# Patient Record
Sex: Female | Born: 1995 | Race: White | Hispanic: No | Marital: Single
Health system: Southern US, Community
[De-identification: ages and names within clinical notes are randomized; demographics above are authoritative.]

---

## 2018-04-15 ENCOUNTER — Other Ambulatory Visit: Payer: Self-pay

## 2018-04-15 ENCOUNTER — Emergency Department (HOSPITAL_COMMUNITY)
Admission: EM | Admit: 2018-04-15 | Discharge: 2018-04-15 | Disposition: A | Discharge status: Home / Self Care | Payer: 59 | Attending: Emergency Medicine | Admitting: Emergency Medicine

## 2018-04-15 ENCOUNTER — Encounter (HOSPITAL_COMMUNITY): Payer: Self-pay | Admitting: Emergency Medicine

## 2018-04-15 ENCOUNTER — Emergency Department (HOSPITAL_COMMUNITY): Payer: 59

## 2018-04-15 DIAGNOSIS — Y939 Activity, unspecified: Secondary | ICD-10-CM | POA: Insufficient documentation

## 2018-04-15 DIAGNOSIS — M25462 Effusion, left knee: Secondary | ICD-10-CM | POA: Insufficient documentation

## 2018-04-15 DIAGNOSIS — Y92012 Bathroom of single-family (private) house as the place of occurrence of the external cause: Secondary | ICD-10-CM | POA: Diagnosis not present

## 2018-04-15 DIAGNOSIS — S8991XA Unspecified injury of right lower leg, initial encounter: Secondary | ICD-10-CM | POA: Diagnosis present

## 2018-04-15 DIAGNOSIS — W1830XA Fall on same level, unspecified, initial encounter: Secondary | ICD-10-CM | POA: Diagnosis not present

## 2018-04-15 DIAGNOSIS — Y999 Unspecified external cause status: Secondary | ICD-10-CM | POA: Diagnosis not present

## 2018-04-15 MED ORDER — TRAMADOL HCL 50 MG PO TABS: 50.0000 mg | ORAL_TABLET | Freq: Four times a day (QID) | ORAL | 0 refills | Status: AC | PRN

## 2018-04-15 MED ORDER — OXYCODONE-ACETAMINOPHEN 5-325 MG PO TABS
1.0000 | ORAL_TABLET | Freq: Once | ORAL | Status: AC
  Administered 2018-04-15: 1 via ORAL
  Filled 2018-04-15: qty 1

## 2018-04-15 NOTE — Discharge Instructions (Signed)
Return to ED for worsening symptoms, red hot or tender joint, additional injuries or falls, numbness in your legs.

## 2018-04-15 NOTE — ED Triage Notes (Signed)
Pt c/o of throbbing left knee pain from a fall last week. Has iced and elevated every day. Says it hasn't gotten any better.

## 2018-04-15 NOTE — ED Provider Notes (Signed)
[facility]-EMERGENCY DEPT Provider Note   CSN: [id number] Arrival date & time: 04/15/18  1755     History   Chief Complaint Chief Complaint  Patient presents with  . Knee Pain    HPI Linda Hendricks is a 22 y.o. female who presents to ED for evaluation of 1 week history of left knee pain.  States that pain began after she accidentally fell and hit her knee on the tile floor of her bathroom.  She has been icing it and elevating with no improvement in her symptoms.  She took 1 dose of Tylenol last night.  She denies any prior fracture, dislocations or procedures in the area.  Denies any other injuries that occurred during her fall.  HPI  History reviewed. No pertinent past medical history.  There are no active problems to display for this patient.   History reviewed. No pertinent surgical history.   OB History   None      Home Medications    Prior to Admission medications   Medication Sig Start Date End Date Taking? Authorizing Provider  traMADol (ULTRAM) 50 MG tablet Take 1 tablet (50 mg total) by mouth every 6 (six) hours as needed. 04/15/18   Dietrich PatesKhatri, Lotus Gover, PA-C    Family History History reviewed. No pertinent family history.  Social History Social History   Tobacco Use  . Smoking status: Not on file  Substance Use Topics  . Alcohol use: Not on file  . Drug use: Not on file     Allergies   Patient has no allergy information on record. Patient reports allergy to aspirin (angioedema) and Bactrim (nausea and vomiting).  Review of Systems Review of Systems  Constitutional: Negative for chills and fever.  Musculoskeletal: Positive for arthralgias and gait problem. Negative for myalgias.  Neurological: Negative for weakness and numbness.     Physical Exam Updated Vital Signs BP (!) 148/98   Pulse 88   Temp (!) 97.3 F (36.3 C) (Oral)   Resp 16   LMP 04/08/2018   SpO2 99%   Physical Exam  Constitutional: She appears  well-developed and well-nourished. No distress.  HENT:  Head: Normocephalic and atraumatic.  Eyes: Conjunctivae and EOM are normal. No scleral icterus.  Neck: Normal range of motion.  Pulmonary/Chest: Effort normal. No respiratory distress.  Musculoskeletal: Normal range of motion. She exhibits tenderness. She exhibits no edema or deformity.  Tenderness to palpation of the entire left knee.  Mild edema noted of the knee.  No overlying erythema or warmth of knee noted.  Full active and passive range of motion without difficulty.  Able to perform straight leg raise of left lower extremity without difficulty.  Area is neurovascularly intact.  Neurological: She is alert.  Skin: No rash noted. She is not diaphoretic.  Psychiatric: She has a normal mood and affect.  Nursing note and vitals reviewed.    ED Treatments / Results  Labs (all labs ordered are listed, but only abnormal results are displayed) Labs Reviewed - No data to display  EKG None  Radiology Dg Knee Complete 4 Views Left  Result Date: 04/15/2018 CLINICAL DATA:  Throbbing knee pain after fall last week. EXAM: LEFT KNEE - COMPLETE 4+ VIEW COMPARISON:  None. FINDINGS: No acute fracture deformity or dislocation. No destructive bony lesions. No degenerative change. Mild prepatellar soft tissue swelling. IMPRESSION: Mild prepatellar soft tissue swelling.  No acute osseous process. Electronically Signed   By: Awilda Metroourtnay  Bloomer M.D.   On: 04/15/2018 18:50  Procedures Procedures (including critical care time)  Medications Ordered in ED Medications  oxyCODONE-acetaminophen (PERCOCET/ROXICET) 5-325 MG per tablet 1 tablet (1 tablet Oral Given 04/15/18 1900)     Initial Impression / Assessment and Plan / ED Course  I have reviewed the triage vital signs and the nursing notes.  Pertinent labs & imaging results that were available during my care of the patient were reviewed by me and considered in my medical decision making (see  chart for details).     22 year old female with no significant past medical history presents for 1 week history of left knee pain after fall.  No improvement with ice and elevation.  1 dose of Tylenol did not help last night.  She is unable to take NSAIDs.  On exam there is mild edema of the left knee with no changes to range of motion noted.  No warmth of joint noted.  Area is neurovascularly intact.  X-ray shows prepatellar effusion with no other significant findings.  Suspect that her symptoms could be due to a contusion from fall.  Will give short course of pain medication, knee sleeve and orthopedic follow-up.  Doubt infectious or vascular cause of symptoms.  Advised to return to ED for any severe worsening symptoms. [place] PMP reviewed with no discrepancies.  Portions of this note were generated with Scientist, clinical (histocompatibility and immunogenetics). Dictation errors may occur despite best attempts at proofreading.  Final Clinical Impressions(s) / ED Diagnoses   Final diagnoses:  Effusion of left knee    ED Discharge Orders         Ordered    traMADol (ULTRAM) 50 MG tablet  Every 6 hours PRN     04/15/18 1909           Dietrich Pates, PA-C 04/15/18 1910    Terrilee Files, MD 04/16/18 1206

## 2020-02-18 IMAGING — CR DG KNEE COMPLETE 4+V*L*
4 series · 4 of 4 positions shown · non-contrast
Comparison: None.

CLINICAL DATA: Throbbing knee pain after fall last week.

EXAM:
LEFT KNEE - COMPLETE 4+ VIEW

[t knee ap left]
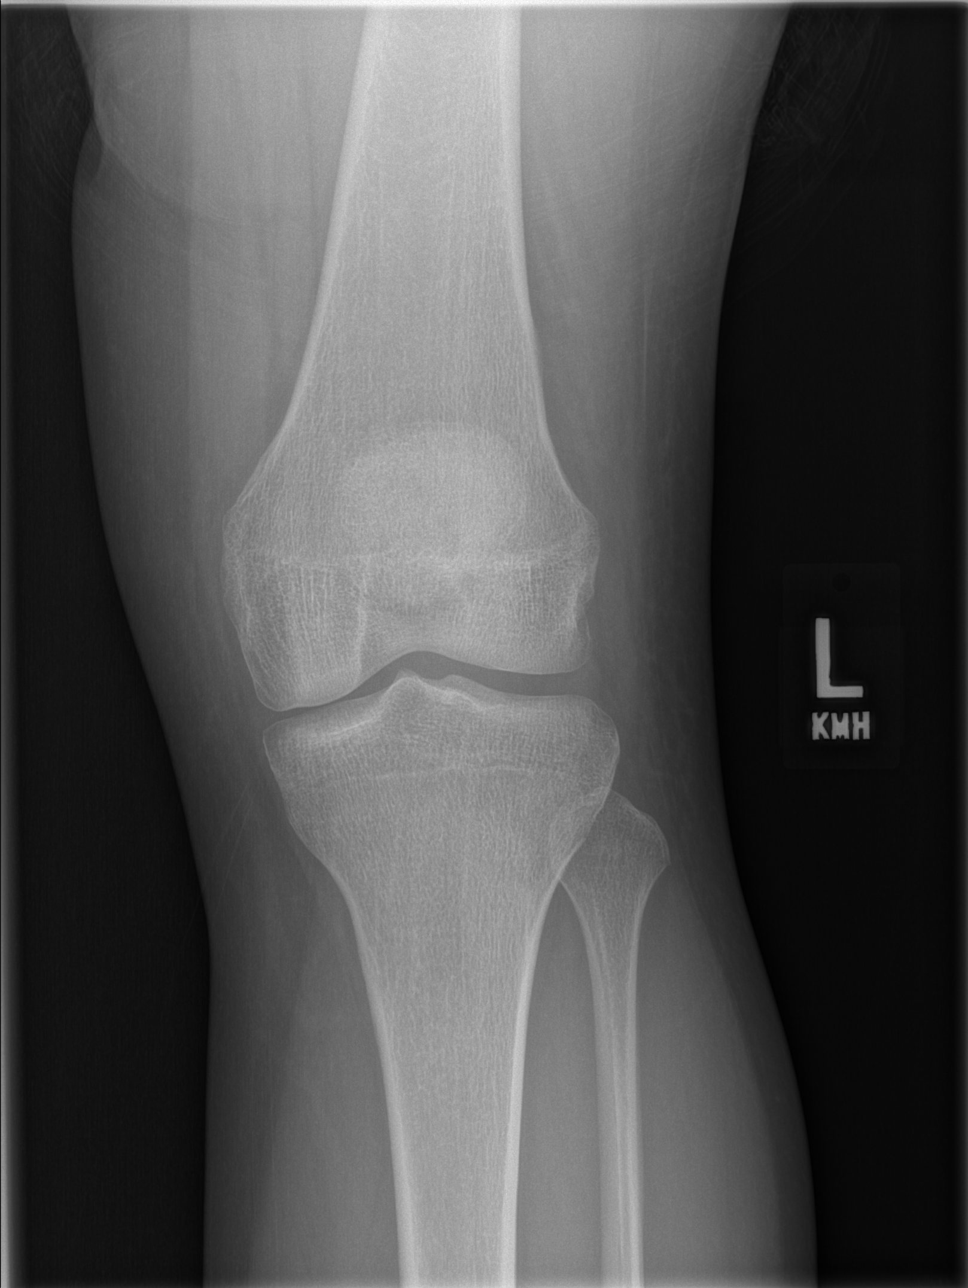

[t knee obl left (1 of 2)]
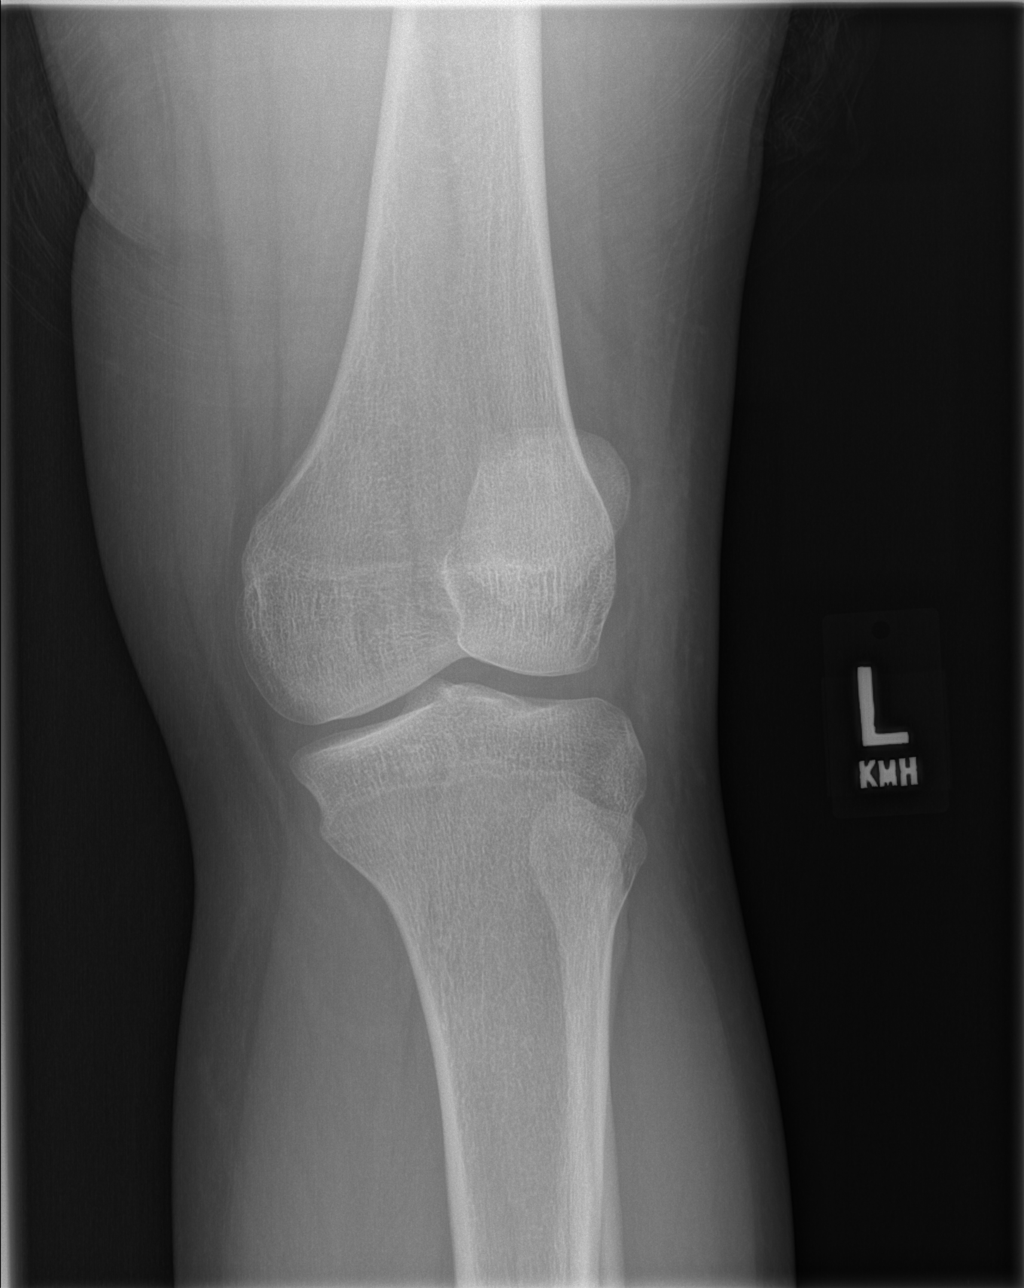

[t knee obl left (2 of 2)]
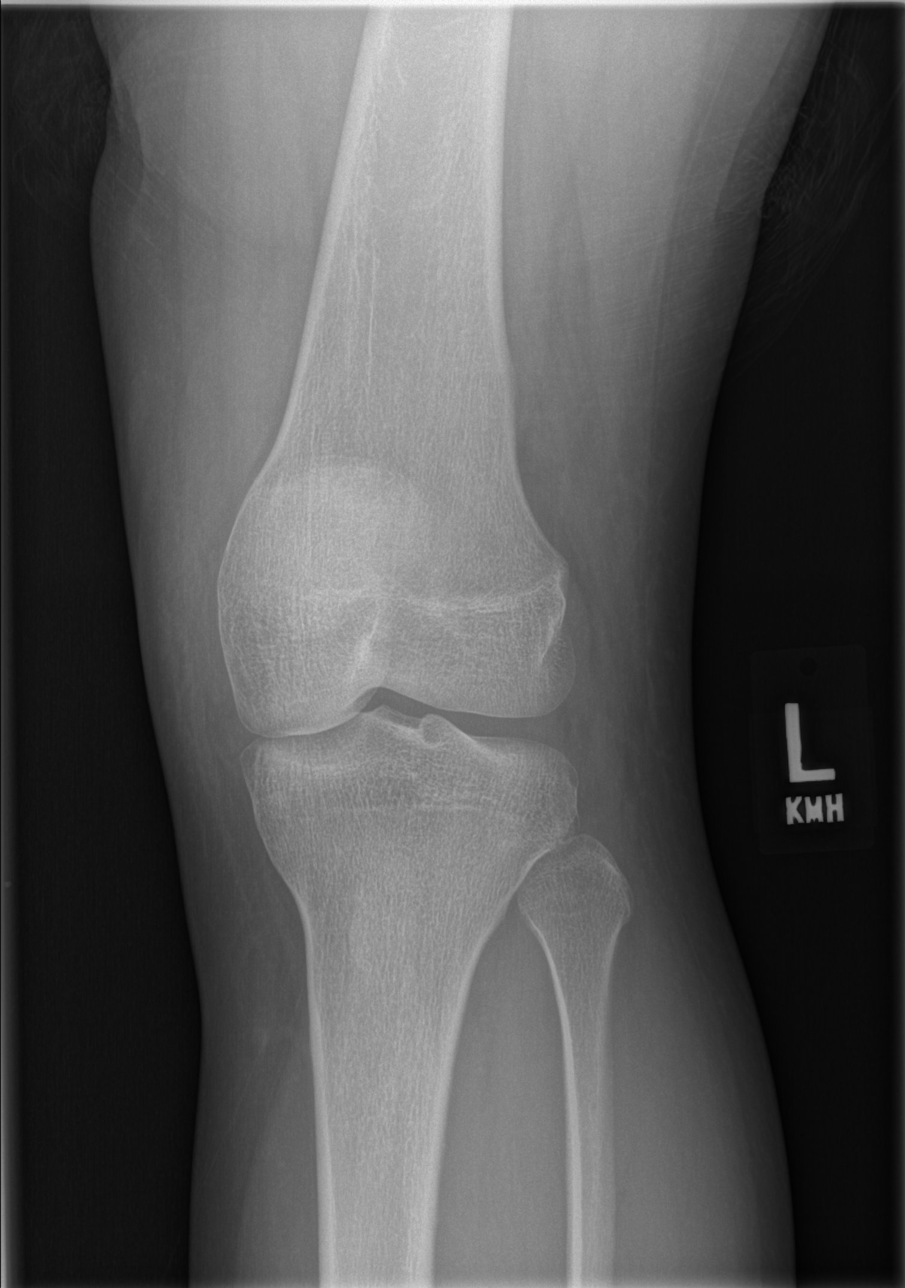

[t knee lat left]
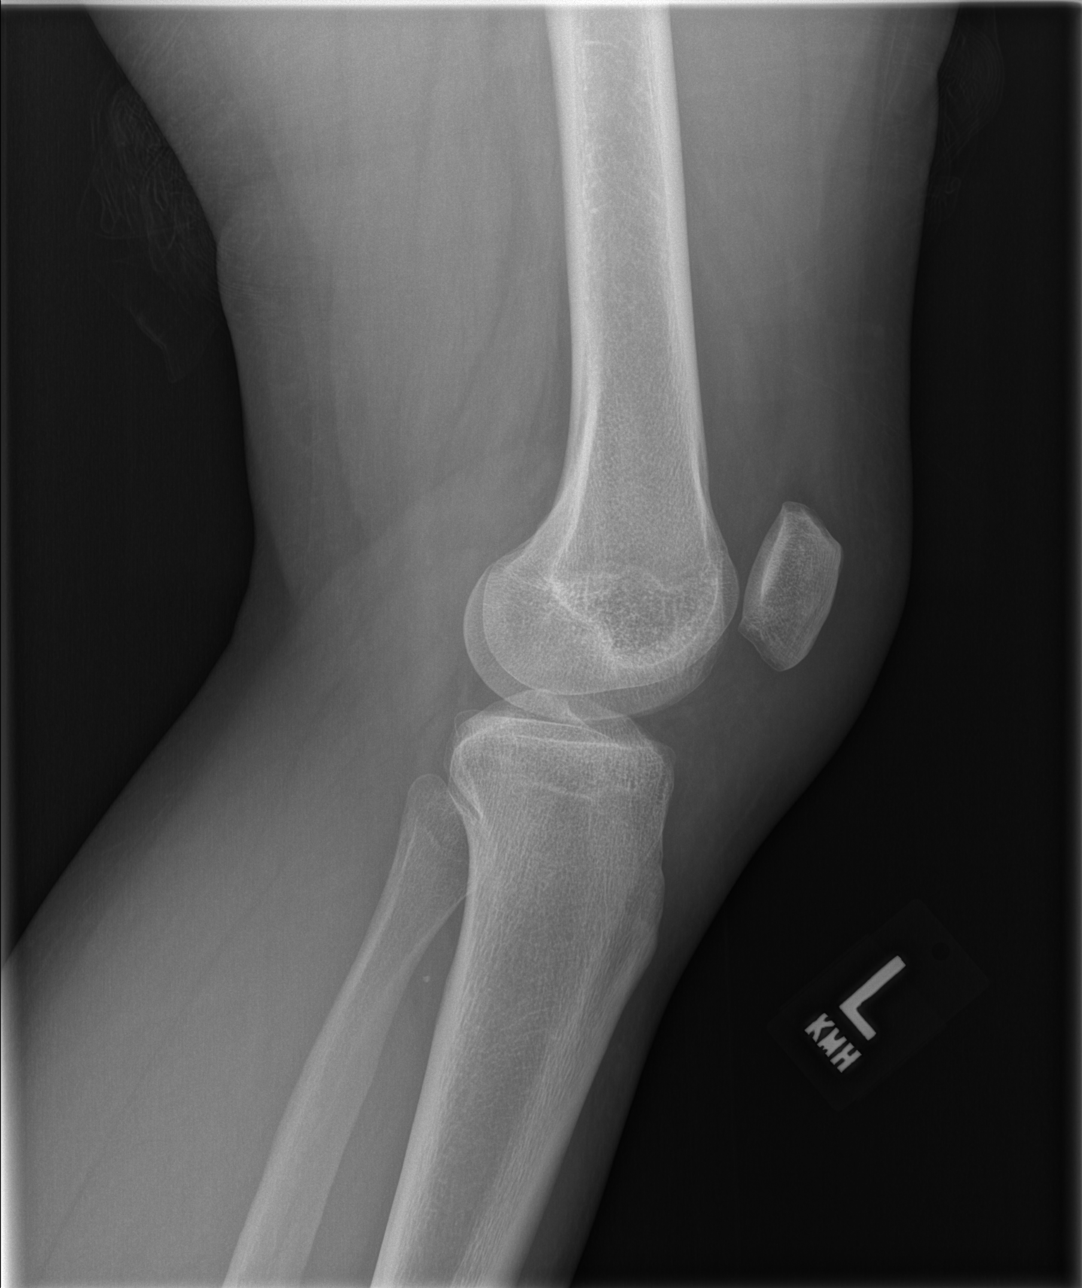

[4 of 4 positions shown; findings below may reference images not displayed]

FINDINGS: No acute fracture deformity or dislocation. No destructive bony
lesions. No degenerative change. Mild prepatellar soft tissue
swelling.
IMPRESSION: Mild prepatellar soft tissue swelling.  No acute osseous process.
